# Patient Record
Sex: Female | Born: 1975 | Race: White | Hispanic: No | Marital: Married | Smoking: Never smoker
Health system: Southern US, Community
[De-identification: ages and names within clinical notes are randomized; demographics above are authoritative.]

## PROBLEM LIST (undated history)

## (undated) DIAGNOSIS — K5010 Crohn's disease of large intestine without complications: Secondary | ICD-10-CM

## (undated) HISTORY — PX: OTHER SURGICAL HISTORY: SHX169

---

## 2004-05-02 ENCOUNTER — Emergency Department (HOSPITAL_COMMUNITY): Admission: EM | Admit: 2004-05-02 | Discharge: 2004-05-03 | Payer: Self-pay | Admitting: Emergency Medicine

## 2007-06-30 ENCOUNTER — Ambulatory Visit: Payer: Self-pay | Admitting: Internal Medicine

## 2011-08-15 ENCOUNTER — Ambulatory Visit: Payer: Self-pay | Admitting: Family Medicine

## 2012-04-30 ENCOUNTER — Encounter: Payer: Self-pay | Admitting: *Deleted

## 2012-05-13 ENCOUNTER — Encounter: Payer: Self-pay | Admitting: *Deleted

## 2012-06-13 ENCOUNTER — Encounter: Payer: Self-pay | Admitting: *Deleted

## 2013-02-14 ENCOUNTER — Ambulatory Visit: Payer: Self-pay | Admitting: Family Medicine

## 2013-04-25 IMAGING — US US OB < 14 WEEKS
1 series · 17 of 28 positions shown · non-contrast
Comparison: none

REASON FOR EXAM: size greater than dates  Alia Tiger  fax
[phone]  ph [phone]  Wo...
COMMENTS:

[Series 1: us ob < 14 weeks · 17 of 49 slices shown]
[im 1/49]
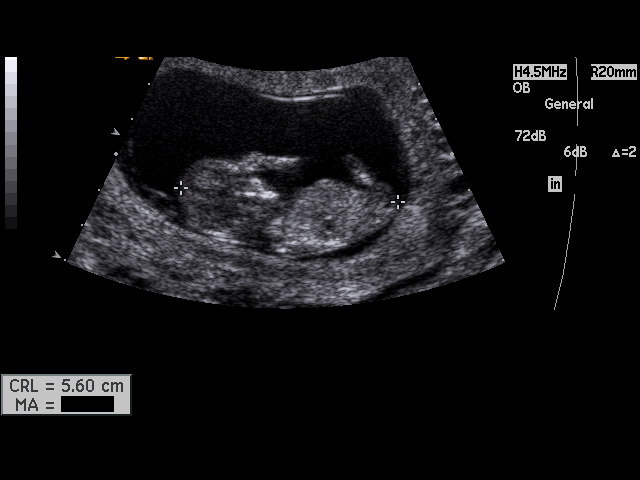
[im 4/49]
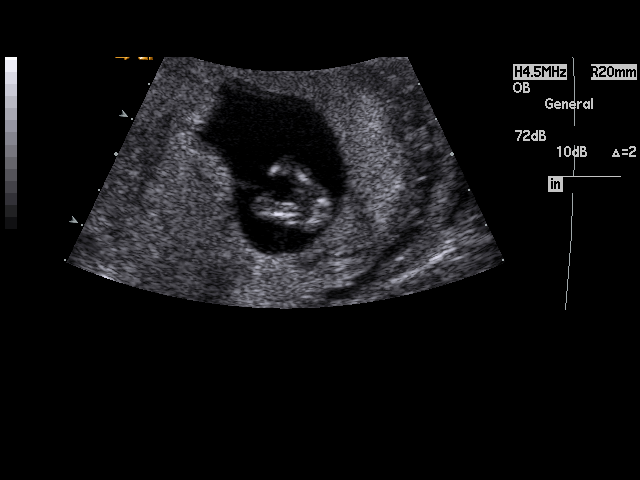
[im 8/49]
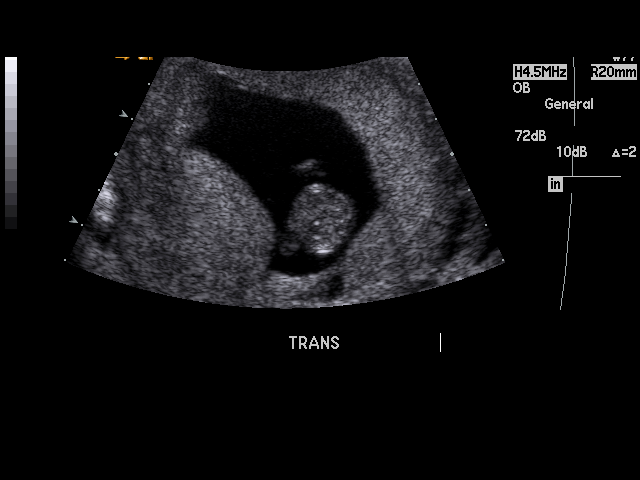
[im 9/49]
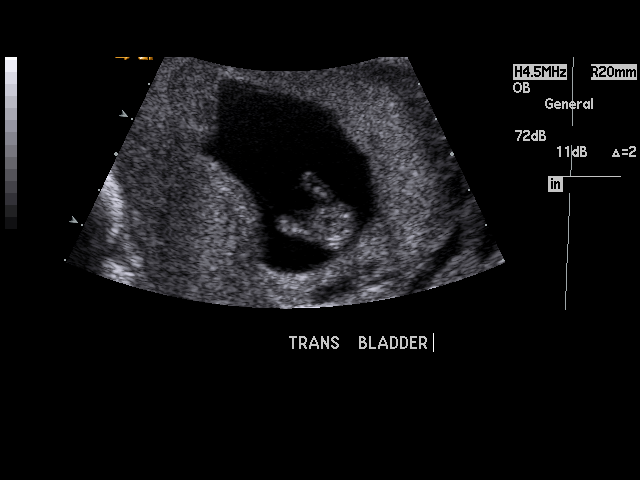
[im 13/49]
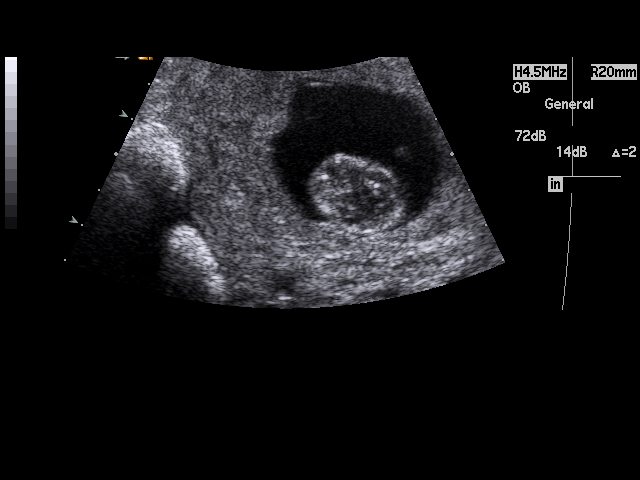
[im 17/49]
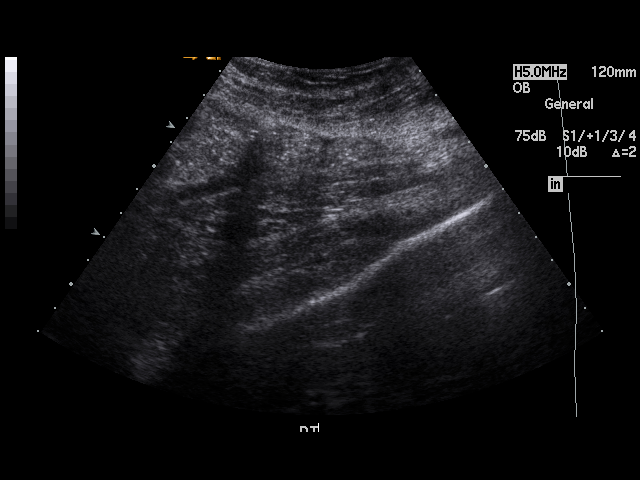
[im 18/49]
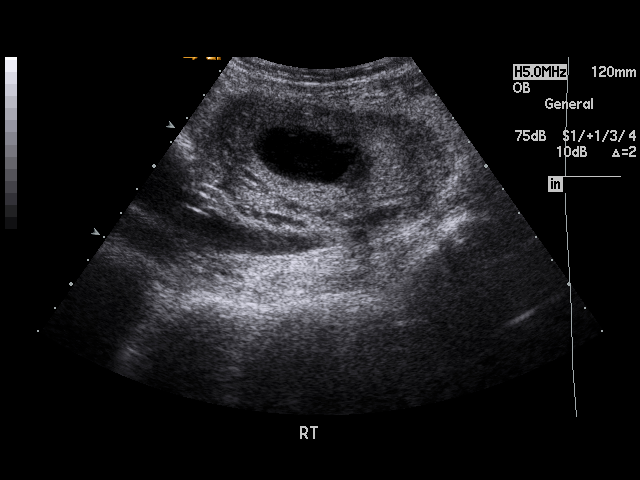
[im 22/49]
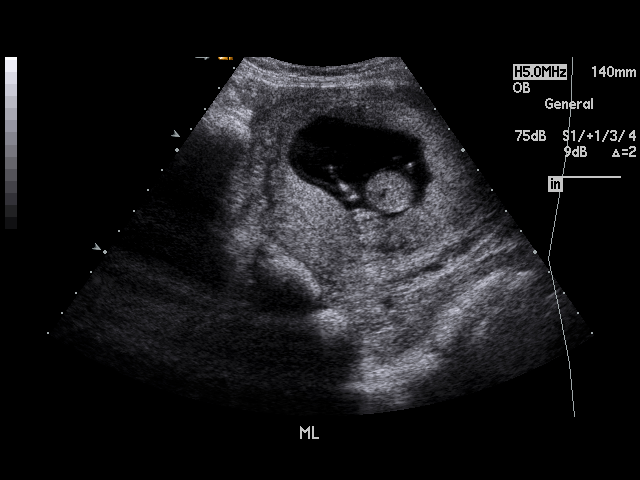
[im 25/49]
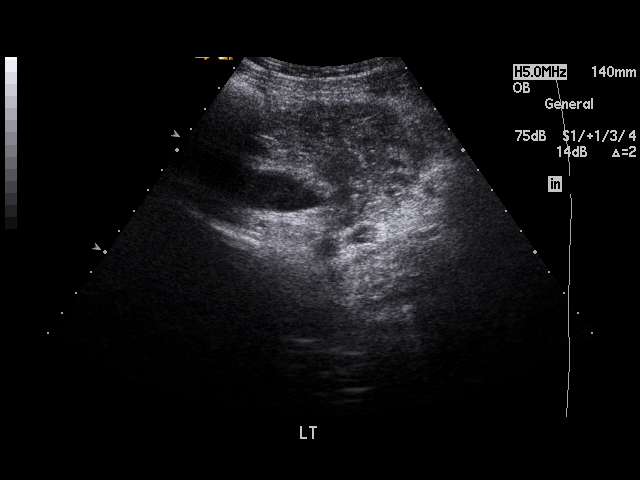
[im 27/49]
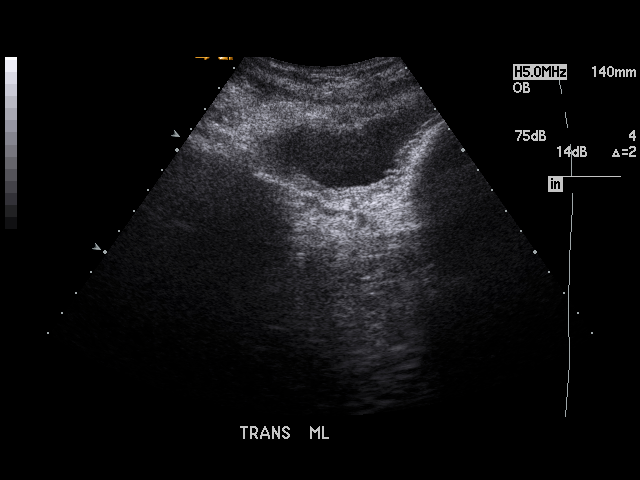
[im 31/49]
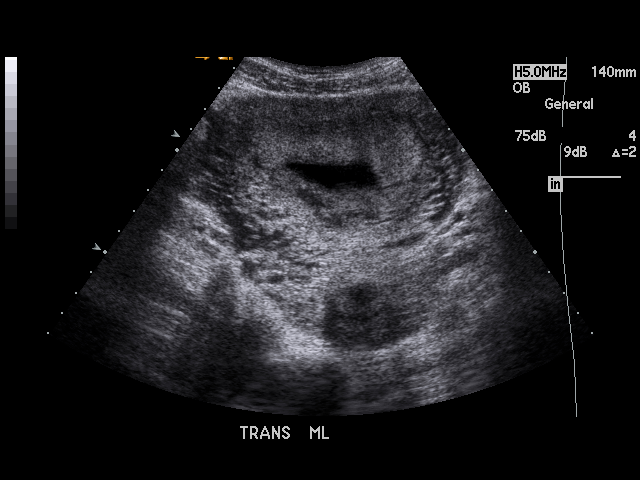
[im 33/49]
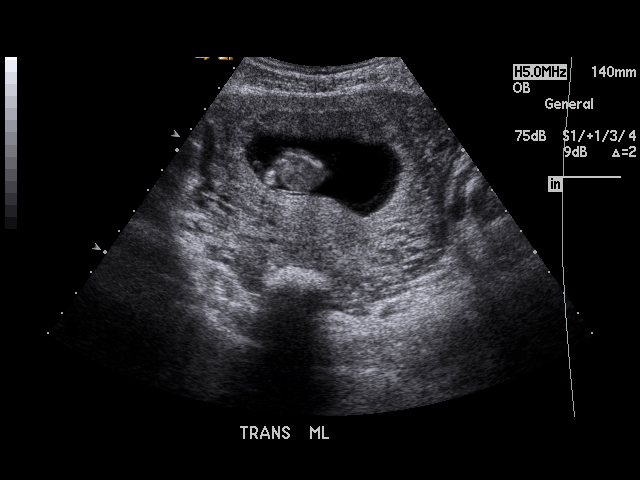
[im 36/49]
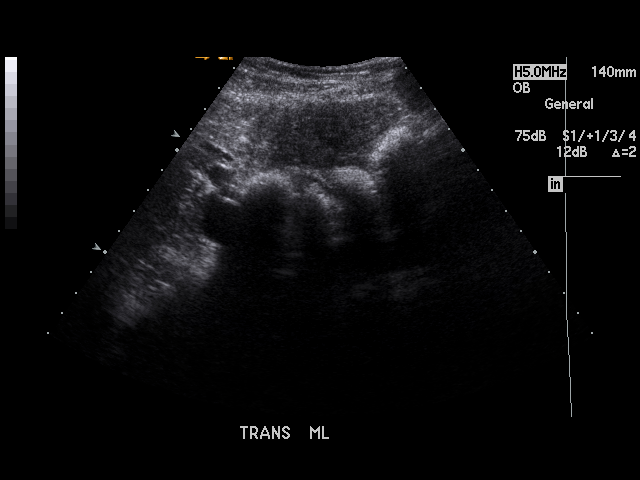
[im 40/49]
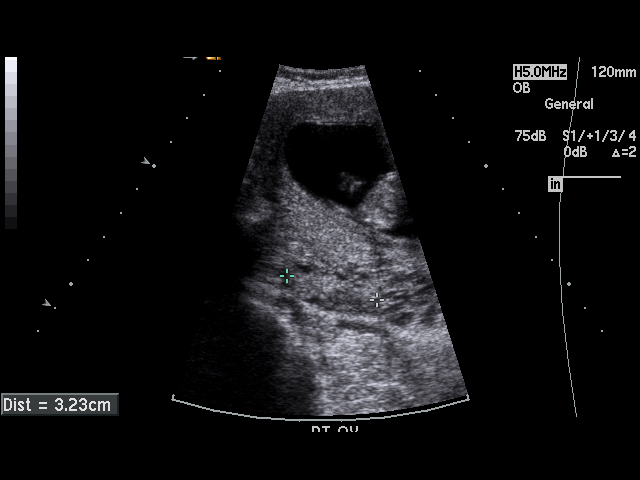
[im 41/49]
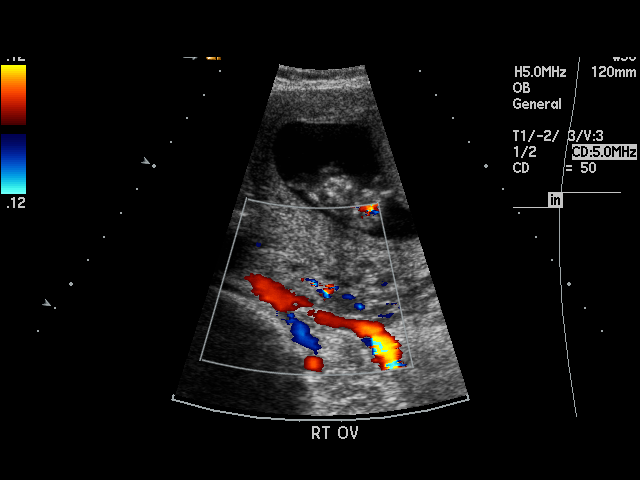
[im 45/49]
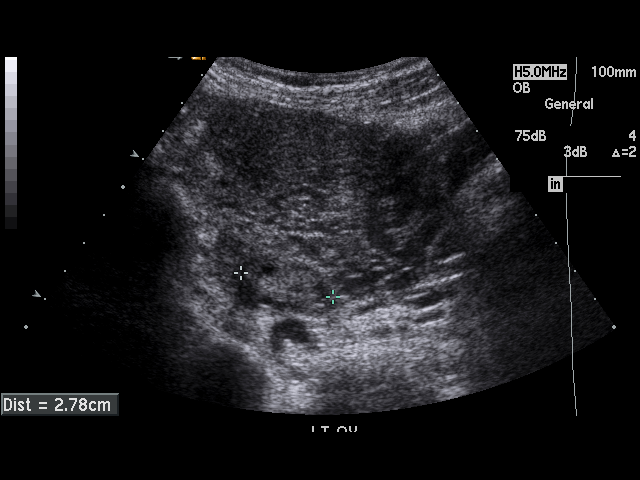
[im 49/49]
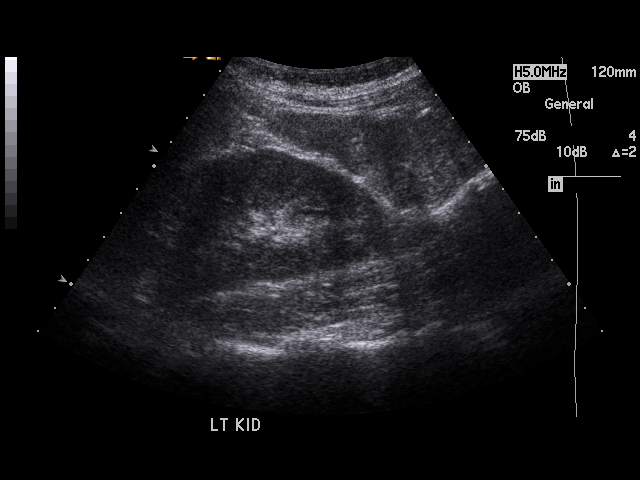

[17 of 28 positions shown; findings below may reference images not displayed]

PROCEDURE:     US  - US OB LESS THAN 14 WEEKS  - August 15, 2011  [DATE]

RESULT:     There is observed a single living intrauterine gestation. Fetal
heart rate was monitored at 163 beats per minute. The crown-rump length
measures 5.55 cm, corresponding to an estimated menstrual age of 12 weeks, 1
day. Ultrasound EDD is February 26, 2012. The maternal ovaries are visualized
bilaterally. The right ovary measures 3.23 cm at maximum diameter and the
left ovary measures 2.78 cm at maximum diameter. No abnormal adnexal masses
are seen. No free fluid is observed in the maternal pelvis. The maternal
kidneys are visualized bilaterally and show no hydronephrosis.
IMPRESSION: 1.  There is a single living intrauterine gestation of approximately 12
weeks, 1 day menstrual age.
2.  Ultrasound EDD is February 26, 2012.

## 2014-10-26 IMAGING — CT CT ABD-PELV W/ CM
1 of 2 series · 15 of 32 positions shown, 19 images · non-contrast
Comparison: none

REASON FOR EXAM: preg test  abd pain eval for lymphoma
COMMENTS:

[Series 2: soft tissue · axial · 0.69mm/px · z∈[-786,-342]mm · 15 of 162 slices shown, 19 images]
[im 7/162  soft-tissue]
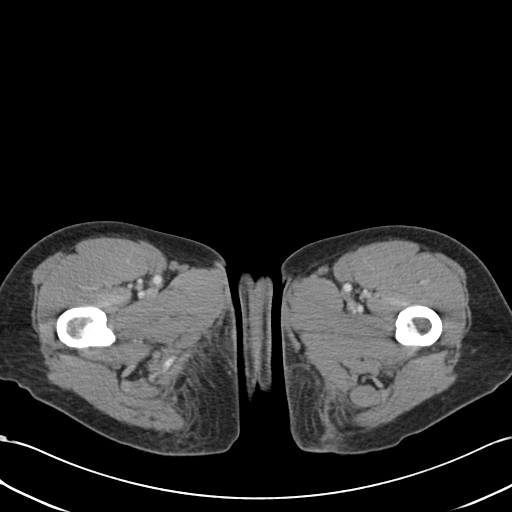
[im 7/162  bone]
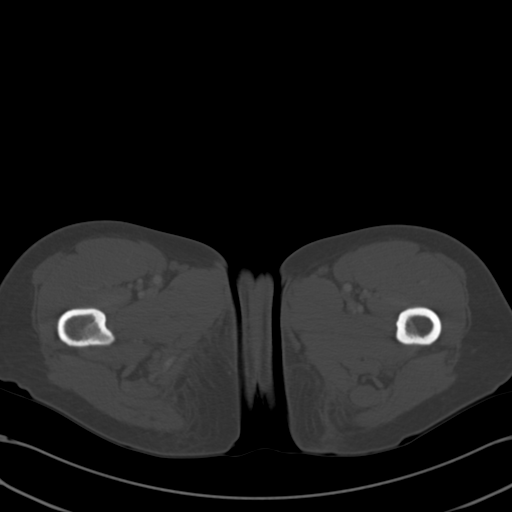
[im 19/162  soft-tissue]
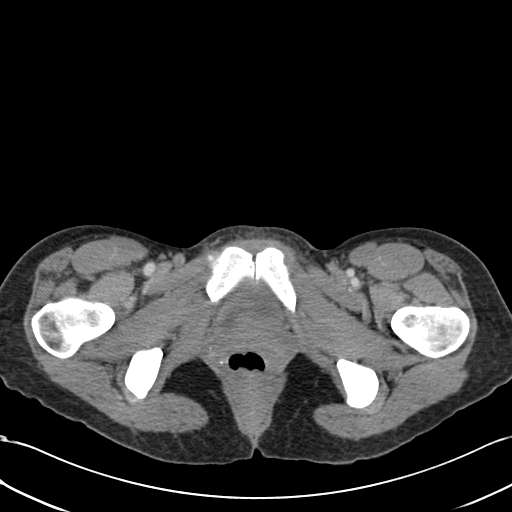
[im 31/162  soft-tissue]
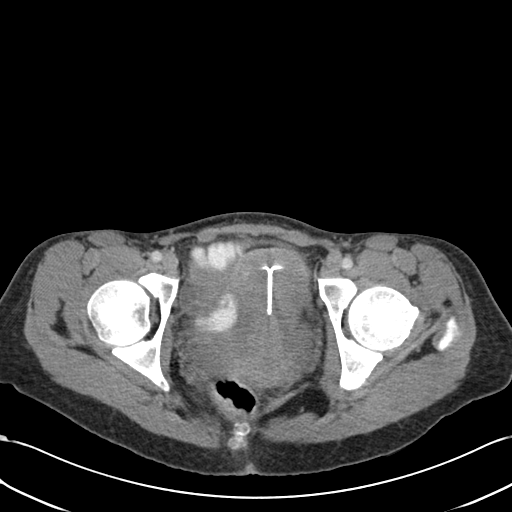
[im 44/162  soft-tissue]
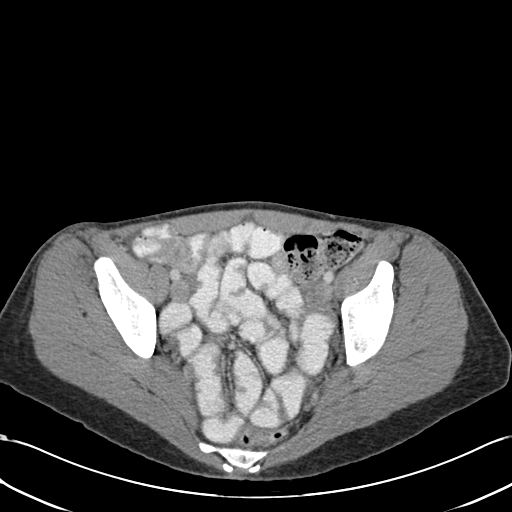
[im 56/162  soft-tissue]
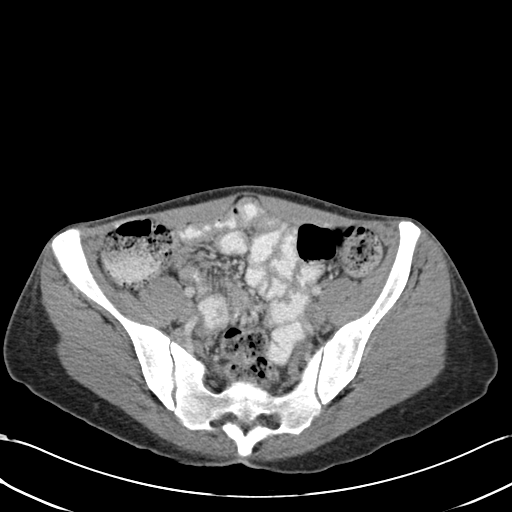
[im 69/162  soft-tissue]
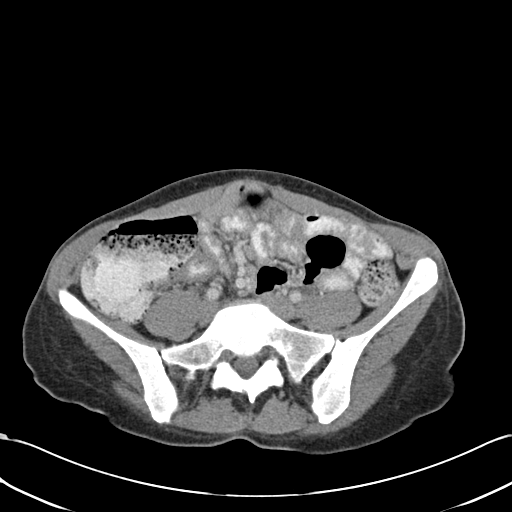
[im 81/162  soft-tissue]
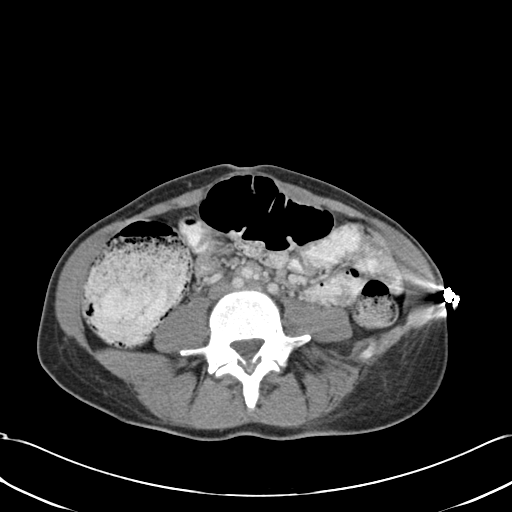
[im 93/162  soft-tissue]
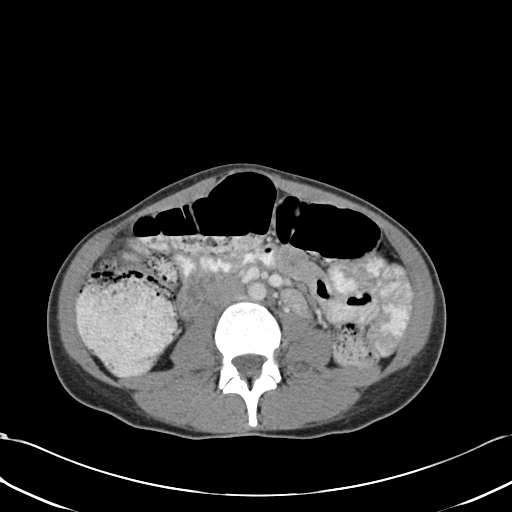
[im 106/162  soft-tissue]
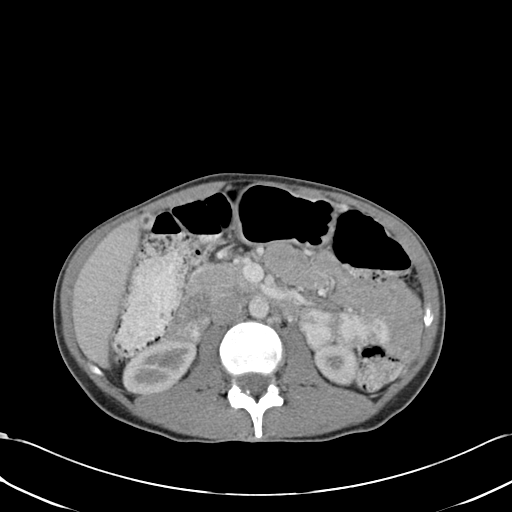
[im 106/162  bone]
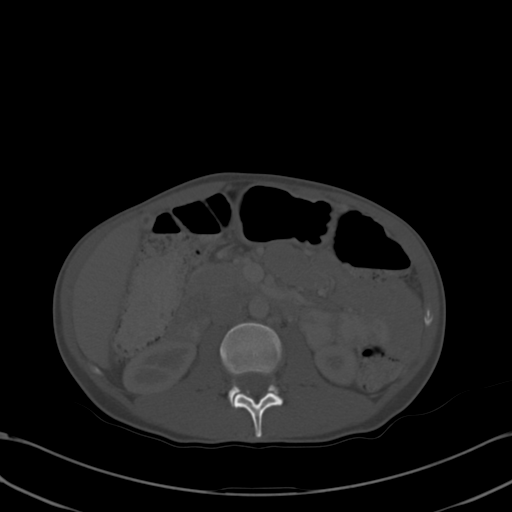
[im 118/162  soft-tissue]
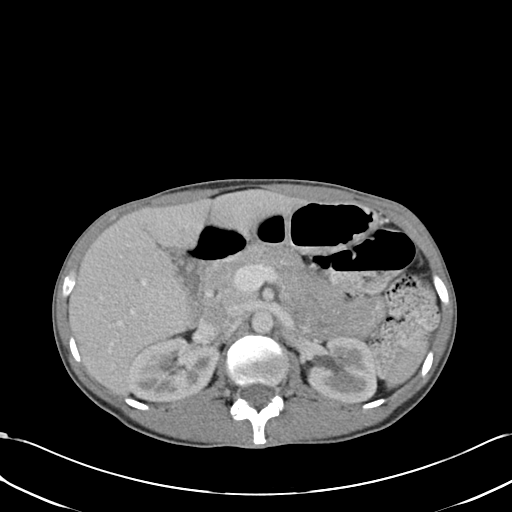
[im 131/162  soft-tissue]
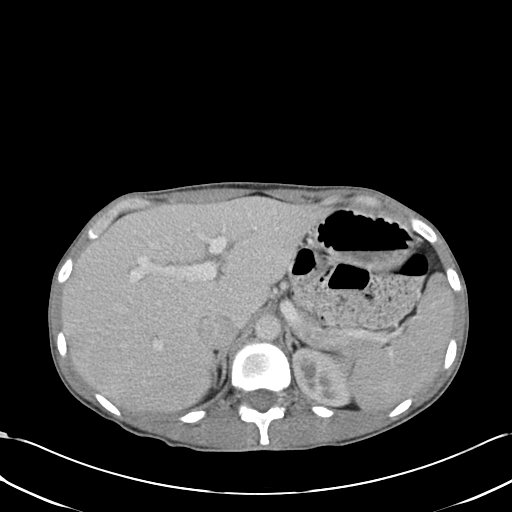
[im 137/162  lung]
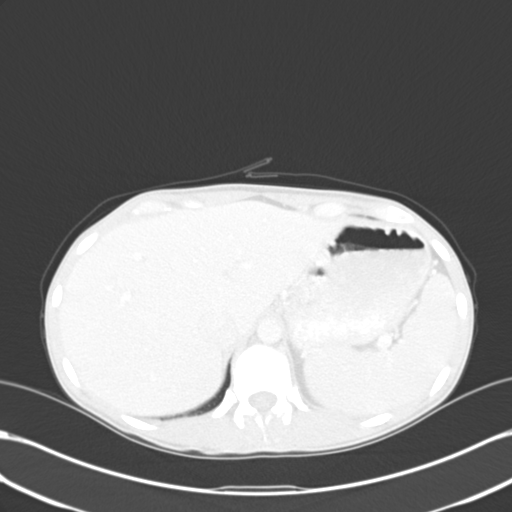
[im 143/162  soft-tissue]
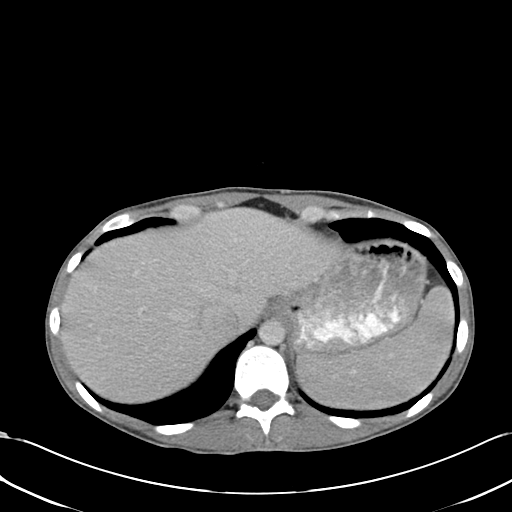
[im 143/162  lung]
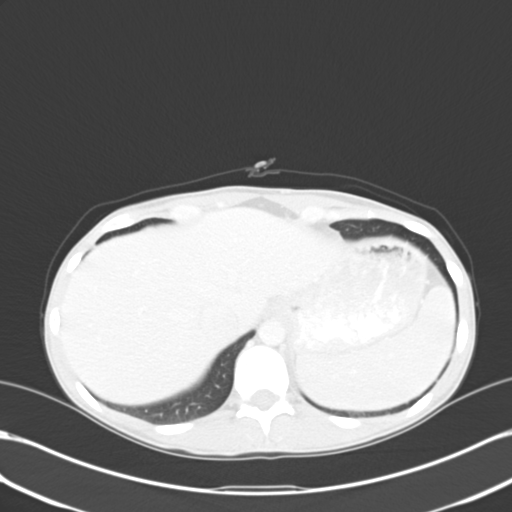
[im 149/162  lung]
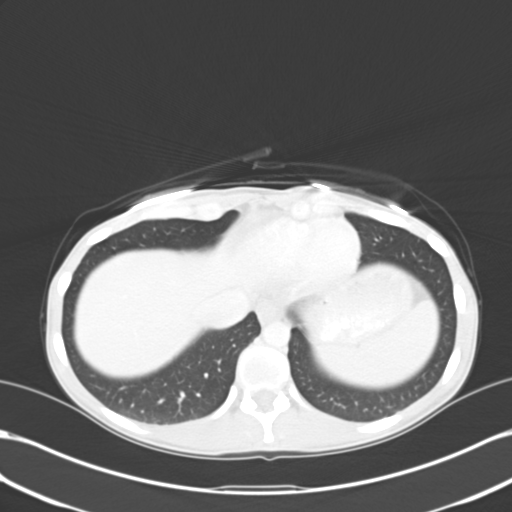
[im 155/162  soft-tissue]
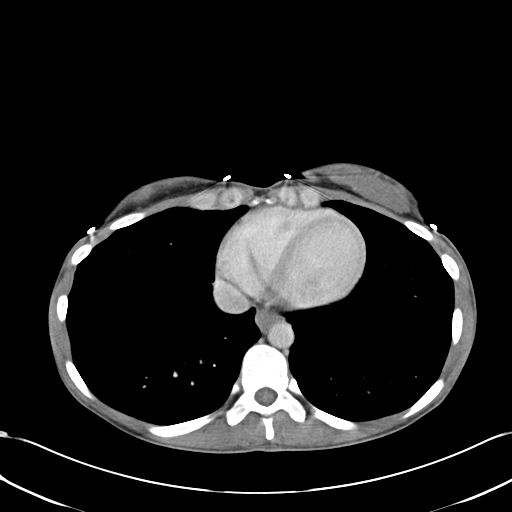
[im 155/162  lung]
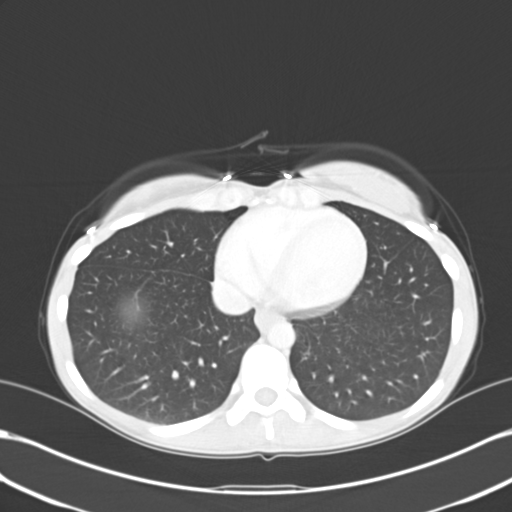

[15 of 32 positions shown; findings below may reference images not displayed]

PROCEDURE:     CT  - CT ABDOMEN / PELVIS  W  - February 14, 2013  [DATE]

RESULT:     CT of the abdomen and pelvis is performed utilizing 85 mL of
Xsovue-5WW iodinated intravenous contrast and oral contrast with images
reconstructed at 3 mm slice thickness in the axial plane. The patient has no
previous similar study for comparison.

Images through the base the lungs demonstrate normal appearing aeration. No
pleural or pericardial effusion is present. Spleen shows a maximum length of
11.9 cm an oblique transverse dimension. Nondistended gallbladder is present
without definite stones. There is a moderate amount of fecal material
present within the colon consistent with constipation. Correlate clinically.
An intrauterine device is seen. No adnexal masses appreciated. The urinary
bladder appears unremarkable. No significant ascites or abnormal fluid
collection is evident. No inflammatory stranding is appreciated. The
appendix is not identified. The aorta is normal in caliber. The kidneys show
no obstruction or mass and enhance normally. The liver, pancreas, adrenal
glands and retroperitoneum appear unremarkable. There is little mesenteric
or subcutaneous fatty tissue present. Bony structures appear to be
unremarkable.
IMPRESSION: 1. Findings suggestive of constipation. There is no finding to suggest
adenopathy or splenomegaly. In some areas, there is poor enhancement of the
small bowel with limited fatty tissue in the retroperitoneum it would be
difficult to exclude the possibility of an area of enlarged lymph nodes.

[REDACTED]

## 2020-04-23 ENCOUNTER — Ambulatory Visit: Payer: Self-pay

## 2020-04-23 DIAGNOSIS — Z23 Encounter for immunization: Secondary | ICD-10-CM

## 2020-04-23 NOTE — Progress Notes (Signed)
   Covid-19 Vaccination Clinic  Name:  Kara Leon    MRN: [id number] DOB: [birth year]  04/23/2020  [name] was observed post Covid-19 immunization for 15 minutes without incident. She was provided with Vaccine Information Sheet and instruction to access the V-Safe system.   [name] was instructed to call 911 with any severe reactions post vaccine: Marland Kitchen Difficulty breathing  . Swelling of face and throat  . A fast heartbeat  . A bad rash all over body  . Dizziness and weakness   Immunizations Administered    Name Date Dose VIS Date Route   Moderna COVID-19 Vaccine 04/23/2020 11:09 AM 0.5 mL 09/2019 Intramuscular   Manufacturer: Moderna   Lot: [id number]   NDC: 15520-802-23

## 2020-05-21 ENCOUNTER — Ambulatory Visit: Payer: Self-pay | Attending: Internal Medicine

## 2020-05-21 DIAGNOSIS — Z23 Encounter for immunization: Secondary | ICD-10-CM

## 2020-05-21 NOTE — Progress Notes (Signed)
   Covid-19 Vaccination Clinic  Name:  Kara Leon    MRN: [id number] DOB: 1976/03/28  05/21/2020  [name] was observed post Covid-19 immunization for 15 minutes without incident. She was provided with Vaccine Information Sheet and instruction to access the V-Safe system.   [name] was instructed to call 911 with any severe reactions post vaccine: Marland Kitchen Difficulty breathing  . Swelling of face and throat  . A fast heartbeat  . A bad rash all over body  . Dizziness and weakness   Immunizations Administered    Name Date Dose VIS Date Route   Moderna COVID-19 Vaccine 05/21/2020 12:36 PM 0.5 mL 09/2019 Intramuscular   Manufacturer: Gala Murdoch   Lot: [id number]   NDC: [id number]

## 2021-09-26 ENCOUNTER — Ambulatory Visit
Admission: EM | Admit: 2021-09-26 | Discharge: 2021-09-26 | Disposition: A | Discharge status: Home / Self Care | Payer: BC Managed Care – PPO

## 2021-09-26 ENCOUNTER — Encounter: Payer: Self-pay | Admitting: Licensed Clinical Social Worker

## 2021-09-26 ENCOUNTER — Other Ambulatory Visit: Payer: Self-pay

## 2021-09-26 DIAGNOSIS — H109 Unspecified conjunctivitis: Secondary | ICD-10-CM | POA: Diagnosis not present

## 2021-09-26 DIAGNOSIS — B9689 Other specified bacterial agents as the cause of diseases classified elsewhere: Secondary | ICD-10-CM

## 2021-09-26 HISTORY — DX: Crohn's disease of large intestine without complications: K50.10

## 2021-09-26 MED ORDER — POLYMYXIN B-TRIMETHOPRIM 10000-0.1 UNIT/ML-% OP SOLN: 1.0000 [drp] | OPHTHALMIC | 0 refills | Status: AC

## 2021-09-26 NOTE — ED Provider Notes (Signed)
[facility]    CSN: [id number] Arrival date & time: 09/26/21  1008      History   Chief Complaint Chief Complaint  Patient presents with   Eye Drainage   Conjunctivitis    HPI Kara Leon is a 45 y.o. female.   45 year old female patient, Kara Leon, presents to urgent care chief complaint of eye drainage, crusting this morning, redness  The history is provided by the patient. No language interpreter was used.   Past Medical History:  Diagnosis Date   Crohn's colitis [facility])     Patient Active Problem List   Diagnosis Date Noted   Bacterial conjunctivitis of both eyes 09/26/2021     Past Surgical History:  Procedure Laterality Date   chron's surgery      OB History   No obstetric history on file.      Home Medications    Prior to Admission medications   Medication Sig Start Date End Date Taking? Authorizing Provider  Adalimumab (HUMIRA PEN) 40 MG/0.4ML PNKT INJECT 1 PEN UNDER THE SKIN EVERY 7 DAYS. 11/15/18  Yes [provider]  trimethoprim-polymyxin b (POLYTRIM) ophthalmic solution Place 1 drop into both eyes every 3 (three) hours while awake for 7 days. 09/26/21 [id number] Yes Lynn Recendiz, Jeanett Schlein, NP    Family History History reviewed. No pertinent family history.  Social History Social History   Tobacco Use   Smoking status: Never   Smokeless tobacco: Never  Vaping Use   Vaping Use: Never used  Substance Use Topics   Alcohol use: Never   Drug use: Never     Allergies   Aspirin   Review of Systems Review of Systems  Constitutional:  Negative for fever.  Eyes:  Positive for discharge, redness and itching.  All other systems reviewed and are negative.   Physical Exam Triage Vital Signs ED Triage Vitals  Enc Vitals Group     BP 09/26/21 1119 99/65     Pulse Rate 09/26/21 1119 65     Resp 09/26/21 1119 16     Temp 09/26/21 1119 97.8 F (36.6 C)     Temp Source 09/26/21 1119 Oral     SpO2 09/26/21  1119 100 %     Weight 09/26/21 1114 132 lb (59.9 kg)     Height 09/26/21 1114 5\' 6"  (1.676 m)     Head Circumference --      Peak Flow --      Pain Score 09/26/21 1114 0     Pain Loc --      Pain Edu? --      Excl. in [place]? --    No data found.  Updated Vital Signs BP 99/65 (BP Location: Left Arm)    Pulse 65    Temp 97.8 F (36.6 C) (Oral)    Resp 16    Ht 5\' 6"  (1.676 m)    Wt 132 lb (59.9 kg)    LMP 09/12/2021    SpO2 100%    BMI 21.31 kg/m   Visual Acuity Right Eye Distance:   Left Eye Distance:   Bilateral Distance:    Right Eye Near:   Left Eye Near:    Bilateral Near:     Physical Exam Vitals and nursing note reviewed.  Constitutional:      Appearance: Normal appearance. She is well-developed and well-groomed.  HENT:     Head: Normocephalic.  Eyes:     General: Lids are normal.     Extraocular  Movements: Extraocular movements intact.     Conjunctiva/sclera:     Right eye: Right conjunctiva is injected.     Left eye: Left conjunctiva is injected.     Pupils: Pupils are equal, round, and reactive to light.     Comments: Reports yellow exudate this a.m.  Neck:     Trachea: Trachea normal.  Cardiovascular:     Rate and Rhythm: Normal rate and regular rhythm.  Pulmonary:     Effort: Pulmonary effort is normal.  Musculoskeletal:     Cervical back: Normal range of motion.  Neurological:     General: No focal deficit present.     Mental Status: She is alert and oriented to person, place, and time.     GCS: GCS eye subscore is 4. GCS verbal subscore is 5. GCS motor subscore is 6.  Psychiatric:        Attention and Perception: Attention normal.        Mood and Affect: Mood normal.        Speech: Speech normal.        Behavior: Behavior normal. Behavior is cooperative.     UC Treatments / Results  Labs (all labs ordered are listed, but only abnormal results are displayed) Labs Reviewed - No data to display  EKG   Radiology No results  found.  Procedures Procedures (including critical care time)  Medications Ordered in UC Medications - No data to display  Initial Impression / Assessment and Plan / UC Course  I have reviewed the triage vital signs and the nursing notes.  Pertinent labs & imaging results that were available during my care of the patient were reviewed by me and considered in my medical decision making (see chart for details).     Ddx: Conjunctivitis, allergies Final Clinical Impressions(s) / UC Diagnoses   Final diagnoses:  Bacterial conjunctivitis of both eyes     Discharge Instructions      Use drops as directed. Take daily allergy med as label directed(over the counter) follow up with PCP/eye doctor.     ED Prescriptions     Medication Sig Dispense Auth. Provider   trimethoprim-polymyxin b (POLYTRIM) ophthalmic solution Place 1 drop into both eyes every 3 (three) hours while awake for 7 days. 10 mL Myosha Cuadras, Para March, NP      PDMP not reviewed this encounter.   Clancy Gourd, NP 09/26/21 1335

## 2021-09-26 NOTE — ED Triage Notes (Signed)
Pt c/o eye drainage and redness. Pt woke up with discharge that was sticky this morning.

## 2021-09-26 NOTE — Discharge Instructions (Signed)
Use drops as directed. Take daily allergy med as label directed(over the counter) follow up with PCP/eye doctor.

## 2021-10-03 ENCOUNTER — Ambulatory Visit
Admission: RE | Admit: 2021-10-03 | Discharge: 2021-10-03 | Disposition: A | Discharge status: Home / Self Care | Payer: BC Managed Care – PPO | Attending: Family Medicine | Admitting: Family Medicine

## 2021-10-03 ENCOUNTER — Ambulatory Visit
Admission: RE | Admit: 2021-10-03 | Discharge: 2021-10-03 | Disposition: A | Discharge status: Home / Self Care | Payer: BC Managed Care – PPO | Source: Ambulatory Visit | Attending: Family Medicine | Admitting: Family Medicine

## 2021-10-03 ENCOUNTER — Ambulatory Visit: Payer: Self-pay

## 2021-10-03 ENCOUNTER — Other Ambulatory Visit: Payer: Self-pay

## 2021-10-03 ENCOUNTER — Other Ambulatory Visit: Payer: Self-pay | Admitting: Family Medicine

## 2021-10-03 DIAGNOSIS — M546 Pain in thoracic spine: Secondary | ICD-10-CM | POA: Insufficient documentation

## 2022-05-23 ENCOUNTER — Other Ambulatory Visit: Payer: Self-pay | Admitting: Family Medicine

## 2022-05-23 DIAGNOSIS — Z1231 Encounter for screening mammogram for malignant neoplasm of breast: Secondary | ICD-10-CM

## 2022-06-17 ENCOUNTER — Ambulatory Visit
Admission: RE | Admit: 2022-06-17 | Discharge: 2022-06-17 | Disposition: A | Discharge status: Home / Self Care | Payer: BC Managed Care – PPO | Source: Ambulatory Visit | Attending: Family Medicine | Admitting: Family Medicine

## 2022-06-17 DIAGNOSIS — Z1231 Encounter for screening mammogram for malignant neoplasm of breast: Secondary | ICD-10-CM | POA: Insufficient documentation

## 2023-08-18 ENCOUNTER — Other Ambulatory Visit: Payer: Self-pay | Admitting: Certified Nurse Midwife

## 2023-08-18 DIAGNOSIS — Z1231 Encounter for screening mammogram for malignant neoplasm of breast: Secondary | ICD-10-CM

## 2023-10-05 ENCOUNTER — Ambulatory Visit (INDEPENDENT_AMBULATORY_CARE_PROVIDER_SITE_OTHER): Payer: BC Managed Care – PPO

## 2023-10-05 ENCOUNTER — Encounter: Payer: Self-pay | Admitting: Emergency Medicine

## 2023-10-05 ENCOUNTER — Ambulatory Visit
Admission: EM | Admit: 2023-10-05 | Discharge: 2023-10-05 | Disposition: A | Discharge status: Home / Self Care | Payer: BC Managed Care – PPO | Attending: Family Medicine | Admitting: Family Medicine

## 2023-10-05 DIAGNOSIS — Z2089 Contact with and (suspected) exposure to other communicable diseases: Secondary | ICD-10-CM | POA: Diagnosis present

## 2023-10-05 DIAGNOSIS — J069 Acute upper respiratory infection, unspecified: Secondary | ICD-10-CM | POA: Insufficient documentation

## 2023-10-05 LAB — RESP PANEL BY RT-PCR (RSV, FLU A&B, COVID)  RVPGX2
Influenza A by PCR: NEGATIVE
Influenza B by PCR: NEGATIVE
Resp Syncytial Virus by PCR: NEGATIVE
SARS Coronavirus 2 by RT PCR: NEGATIVE

## 2023-10-05 NOTE — ED Triage Notes (Signed)
Pt c/o cough, runny nose and fatigue since yesterday. Pt took NyQuil last night.

## 2023-10-05 NOTE — Discharge Instructions (Addendum)
Your COVID, influenza and RSV test are all negative. Your chest xray did not show evidence of pneumonia though the radiologist has not yet read it. If they find something that I didn't, I will call you.    You can take Tylenol as needed for fever reduction and pain relief.    For cough: honey 1/2 to 1 teaspoon (you can dilute the honey in water or another fluid).  You can also use guaifenesin and dextromethorphan for cough. You can use a humidifier for chest congestion and cough.  If you don't have a humidifier, you can sit in the bathroom with the hot shower running.      For sore throat: try warm salt water gargles, Mucinex sore throat cough drops or cepacol lozenges, throat spray, warm tea or water with lemon/honey, popsicles or ice, or OTC cold relief medicine for throat discomfort. You can also purchase chloraseptic spray at the pharmacy or dollar store.   For congestion: take a daily anti-histamine like Zyrtec, Claritin, and a oral decongestant, such as pseudoephedrine.  You can also use Flonase 1-2 sprays in each nostril daily. Afrin is also a good option, if you do not have high blood pressure.    It is important to stay hydrated: drink plenty of fluids (water, gatorade/powerade/pedialyte, juices, or teas) to keep your throat moisturized and help further relieve irritation/discomfort.    Return or go to the Emergency Department if symptoms worsen or do not improve in the next few days

## 2023-10-05 NOTE — ED Provider Notes (Signed)
[facility]    CSN: [id number] Arrival date & time: 10/05/23  1302      History   Chief Complaint Chief Complaint  Patient presents with   Cough   Nasal Congestion   Fatigue    HPI AMIJA DODRILL is a 47 y.o. female.   HPI  History obtained from the patient. Marilda presents for cough, fatigue, rhinorrhea that started 2 days ago. Went shopping and started feeling fatigued. Her daughter had walking pneumonia.  No fever. Tmax 99.1 F. Taking immunosuppresants for Chron's. No diarrhea, abdominal pain or vomiting. Denies sore throat, headache. Took NyQuil last night.     Past Medical History:  Diagnosis Date   Crohn's colitis [facility])     Patient Active Problem List   Diagnosis Date Noted   Bacterial conjunctivitis of both eyes 09/26/2021    Past Surgical History:  Procedure Laterality Date   chron's surgery      OB History   No obstetric history on file.      Home Medications    Prior to Admission medications   Medication Sig Start Date End Date Taking? Authorizing Provider  FERREX 150 150 MG capsule TAKE 1 CAPSULE (150 MG TOTAL) BY MOUTH TWO (2) TIMES A DAY. 02/07/20  Yes [provider]  hydrocortisone (ANUSOL-HC) 25 MG suppository Place 25 mg rectally 2 (two) times daily. 08/06/23  Yes [provider]  RINVOQ 45 MG TB24 Take by mouth. 07/13/23  Yes [provider]  cyanocobalamin (VITAMIN B12) 1000 MCG tablet Take by mouth.    [provider]    Family History History reviewed. No pertinent family history.  Social History Social History   Tobacco Use   Smoking status: Never   Smokeless tobacco: Never  Vaping Use   Vaping status: Never Used  Substance Use Topics   Alcohol use: Never   Drug use: Never     Allergies   Aspirin and Gluten meal   Review of Systems Review of Systems: negative unless otherwise stated in HPI.      Physical Exam Triage Vital Signs ED Triage Vitals  Encounter  Vitals Group     BP 10/05/23 1504 105/80     Systolic BP Percentile --      Diastolic BP Percentile --      Pulse Rate 10/05/23 1504 88     Resp 10/05/23 1504 18     Temp 10/05/23 1504 99 F (37.2 C)     Temp Source 10/05/23 1504 Oral     SpO2 10/05/23 1504 100 %     Weight --      Height --      Head Circumference --      Peak Flow --      Pain Score 10/05/23 1502 0     Pain Loc --      Pain Education --      Exclude from Growth Chart --    No data found.  Updated Vital Signs BP 105/80 (BP Location: Left Arm)   Pulse 88   Temp 99 F (37.2 C) (Oral)   Resp 18   LMP 09/21/2023 (Approximate)   SpO2 100%   Visual Acuity Right Eye Distance:   Left Eye Distance:   Bilateral Distance:    Right Eye Near:   Left Eye Near:    Bilateral Near:     Physical Exam GEN:     alert, ill but non-toxic appearing female in no distress  HENT:  mucus membranes moist, oropharyngeal without lesions or erythema, no tonsillar hypertrophy or exudates, no nasal discharge EYES:   no scleral injection or discharge NECK:  normal ROM, no meningismus   RESP:  no increased work of breathing, clear to auscultation bilaterally though decreased air movement  CVS:   regular rate and rhythm Skin:   warm and dry    UC Treatments / Results  Labs (all labs ordered are listed, but only abnormal results are displayed) Labs Reviewed  RESP PANEL BY RT-PCR (RSV, FLU A&B, COVID)  [id number]    EKG   Radiology DG Chest 2 View Result Date: 10/05/2023 CLINICAL DATA:  Cough EXAM: CHEST - 2 VIEW COMPARISON:  None Available. FINDINGS: The heart size and mediastinal contours are within normal limits. No focal pulmonary opacity. No pleural effusion or pneumothorax. The visualized upper abdomen is unremarkable. No acute osseous abnormality. IMPRESSION: No active cardiopulmonary disease. Electronically Signed   By: Jacob Moores M.D.   On: 10/05/2023 16:18    Procedures Procedures (including critical care  time)  Medications Ordered in UC Medications - No data to display  Initial Impression / Assessment and Plan / UC Course  I have reviewed the triage vital signs and the nursing notes.  Pertinent labs & imaging results that were available during my care of the patient were reviewed by me and considered in my medical decision making (see chart for details).       Pt is a 47 y.o. female who presents for 1 day of respiratory symptoms.  She had a close exposure to pneumonia.  Arra is afebrile here without recent antipyretics. Satting well on room air. Overall pt is ill but non-toxic appearing, well hydrated, without respiratory distress. Pulmonary exam is clear but she had decreased air movement bilaterally.  RSV, influenza and COVID panel obtained and was negative. Recommended chest x-ray and she is agreeable.  Chest xray personally reviewed by me without focal pneumonia, pleural effusion, cardiomegaly or pneumothorax.  Radiologist impression and reviewed.  History consistent with viral respiratory illness. Discussed symptomatic treatment.  Explained lack of efficacy of antibiotics in viral disease.  Typical duration of symptoms discussed.   Return and ED precautions given and voiced understanding. Discussed MDM, treatment plan and plan for follow-up with patient who agrees with plan.     Final Clinical Impressions(s) / UC Diagnoses   Final diagnoses:  Viral URI with cough  Pneumonia exposure     Discharge Instructions      Your COVID, influenza and RSV test are all negative. Your chest xray did not show evidence of pneumonia though the radiologist has not yet read it. If they find something that I didn't, I will call you.    You can take Tylenol as needed for fever reduction and pain relief.    For cough: honey 1/2 to 1 teaspoon (you can dilute the honey in water or another fluid).  You can also use guaifenesin and dextromethorphan for cough. You can use a humidifier for chest  congestion and cough.  If you don't have a humidifier, you can sit in the bathroom with the hot shower running.      For sore throat: try warm salt water gargles, Mucinex sore throat cough drops or cepacol lozenges, throat spray, warm tea or water with lemon/honey, popsicles or ice, or OTC cold relief medicine for throat discomfort. You can also purchase chloraseptic spray at the pharmacy or dollar store.   For congestion: take a daily anti-histamine  like Zyrtec, Claritin, and a oral decongestant, such as pseudoephedrine.  You can also use Flonase 1-2 sprays in each nostril daily. Afrin is also a good option, if you do not have high blood pressure.    It is important to stay hydrated: drink plenty of fluids (water, gatorade/powerade/pedialyte, juices, or teas) to keep your throat moisturized and help further relieve irritation/discomfort.    Return or go to the Emergency Department if symptoms worsen or do not improve in the next few days      ED Prescriptions   None    PDMP not reviewed this encounter.   Katha Cabal, DO 10/05/23 1656

## 2023-10-14 ENCOUNTER — Ambulatory Visit
Admission: EM | Admit: 2023-10-14 | Discharge: 2023-10-14 | Disposition: A | Discharge status: Home / Self Care | Payer: 59 | Attending: Family Medicine | Admitting: Family Medicine

## 2023-10-14 ENCOUNTER — Ambulatory Visit (INDEPENDENT_AMBULATORY_CARE_PROVIDER_SITE_OTHER): Payer: 59

## 2023-10-14 DIAGNOSIS — J989 Respiratory disorder, unspecified: Secondary | ICD-10-CM

## 2023-10-14 DIAGNOSIS — J189 Pneumonia, unspecified organism: Secondary | ICD-10-CM

## 2023-10-14 DIAGNOSIS — R509 Fever, unspecified: Secondary | ICD-10-CM

## 2023-10-14 MED ORDER — PROMETHAZINE-DM 6.25-15 MG/5ML PO SYRP: 5.0000 mL | ORAL_SOLUTION | Freq: Four times a day (QID) | ORAL | 0 refills | Status: AC | PRN

## 2023-10-14 MED ORDER — AMOXICILLIN-POT CLAVULANATE 875-125 MG PO TABS: 1.0000 | ORAL_TABLET | Freq: Two times a day (BID) | ORAL | 0 refills | Status: AC

## 2023-10-14 MED ORDER — PREDNISONE 10 MG (21) PO TBPK: ORAL_TABLET | Freq: Every day | ORAL | 0 refills | Status: AC

## 2023-10-14 MED ORDER — AZITHROMYCIN 250 MG PO TABS: ORAL_TABLET | ORAL | 0 refills | Status: AC

## 2023-10-14 NOTE — ED Triage Notes (Signed)
 Pt c/o continued cough x9days  Pt states that she was here on 10/05/23 and tested negative for covid and flu  Pt states that the cough has gotten worse and she had a temperature of 99.

## 2023-10-14 NOTE — Discharge Instructions (Addendum)
 Your chest x-ray showed an area that could be concerning for pneumonia.  I will call you if the radiologist calls this a pneumonia, you will see your results in MyChart.    Stop by the pharmacy to pick up your steroids, antibiotics and cough syrup.  Start your steroids tomorrow as they may keep you up at night.

## 2023-10-14 NOTE — ED Provider Notes (Signed)
 [facility]    CSN: [id number] Arrival date & time: 10/14/23  1536      History   Chief Complaint Chief Complaint  Patient presents with   Cough    HPI Kara Leon is a 48 y.o. female.   HPI  History obtained from the patient. Kara Leon presents for ongoing cough for over a week.  Cough is not improved since she was seen last about a week ago.  She had negative testing at that time.  She started having fevers a few days ago.  Feels like her symptoms are getting worse.  Continues to have mild rhinorrhea, some bodyaches.  No sore throat, vomiting or diarrhea.      Past Medical History:  Diagnosis Date   Crohn's colitis [facility])     Patient Active Problem List   Diagnosis Date Noted   Bacterial conjunctivitis of both eyes 09/26/2021    Past Surgical History:  Procedure Laterality Date   chron's surgery      OB History   No obstetric history on file.      Home Medications    Prior to Admission medications   Medication Sig Start Date End Date Taking? Authorizing Provider  amoxicillin -clavulanate (AUGMENTIN ) 875-125 MG tablet Take 1 tablet by mouth every 12 (twelve) hours. 10/14/23  Yes Madelon Welsch, DO  azithromycin  (ZITHROMAX  Z-PAK) 250 MG tablet Take 2 tablets on day 1 then 1 tablet daily 10/14/23  Yes Dmarion Perfect, DO  cyanocobalamin (VITAMIN B12) 1000 MCG tablet Take by mouth.   Yes [provider]  FERREX 150 150 MG capsule TAKE 1 CAPSULE (150 MG TOTAL) BY MOUTH TWO (2) TIMES A DAY. 02/07/20  Yes [provider]  hydrocortisone (ANUSOL-HC) 25 MG suppository Place 25 mg rectally 2 (two) times daily. 08/06/23  Yes [provider]  predniSONE  (STERAPRED UNI-PAK 21 TAB) 10 MG (21) TBPK tablet Take by mouth daily. Take 6 tabs by mouth daily for 1, then 5 tabs for 1 day, then 4 tabs for 1 day, then 3 tabs for 1 day, then 2 tabs for 1 day, then 1 tab for 1 day. 10/14/23  Yes Macgregor Aeschliman, DO  promethazine -dextromethorphan  (PROMETHAZINE -DM) 6.25-15 MG/5ML syrup Take 5 mLs by mouth 4 (four) times daily as needed. 10/14/23  Yes Meshach Perry, DO  RINVOQ 45 MG TB24 Take by mouth. 07/13/23  Yes [provider]    Family History History reviewed. No pertinent family history.  Social History Social History   Tobacco Use   Smoking status: Never   Smokeless tobacco: Never  Vaping Use   Vaping status: Never Used  Substance Use Topics   Alcohol use: Yes   Drug use: Never     Allergies   Aspirin and Gluten meal   Review of Systems Review of Systems: negative unless otherwise stated in HPI.      Physical Exam Triage Vital Signs ED Triage Vitals  Encounter Vitals Group     BP 10/14/23 1643 (!) 88/67     Systolic BP Percentile --      Diastolic BP Percentile --      Pulse Rate 10/14/23 1643 97     Resp --      Temp 10/14/23 1643 100.2 F (37.9 C)     Temp Source 10/14/23 1643 Oral     SpO2 10/14/23 1643 99 %     Weight 10/14/23 1641 132 lb (59.9 kg)     Height 10/14/23 1641 5' 6 (1.676 m)  Head Circumference --      Peak Flow --      Pain Score 10/14/23 1641 3     Pain Loc --      Pain Education --      Exclude from Growth Chart --    No data found.  Updated Vital Signs BP (!) 88/67 (BP Location: Left Arm)   Pulse 97   Temp 100.2 F (37.9 C) (Oral)   Ht 5' 6 (1.676 m)   Wt 59.9 kg   LMP 10/14/2023 (Approximate)   SpO2 99%   BMI 21.31 kg/m   Visual Acuity Right Eye Distance:   Left Eye Distance:   Bilateral Distance:    Right Eye Near:   Left Eye Near:    Bilateral Near:     Physical Exam GEN:     alert, non-toxic appearing female in no distress    EYES:   no scleral injection or discharge NECK:  normal ROM, no meningismus   RESP:  no increased work of breathing, scattered rhonchi, cough CVS:   regular rate and rhythm Skin:   warm and dry, no rash on visible skin    UC Treatments / Results  Labs (all labs ordered are listed, but only abnormal results  are displayed) Labs Reviewed - No data to display  EKG   Radiology DG Chest 2 View Result Date: 10/14/2023 CLINICAL DATA:  Cough and fever. EXAM: CHEST - 2 VIEW COMPARISON:  10/05/2023 FINDINGS: Heart size and mediastinal contours of appear normal. No pleural fluid or interstitial edema. Asymmetric opacities identified within the posterior left lower lobe. Right lung appears clear. Visualized osseous structures are unremarkable. IMPRESSION: Asymmetric opacities within the posterior left lower lobe compatible with pneumonia. Followup PA and lateral chest X-ray is recommended in 3-4 weeks following trial of antibiotic therapy to ensure resolution and exclude underlying malignancy. Electronically Signed   By: Waddell Calk M.D.   On: 10/14/2023 17:39    Procedures Procedures (including critical care time)  Medications Ordered in UC Medications - No data to display  Initial Impression / Assessment and Plan / UC Course  I have reviewed the triage vital signs and the nursing notes.  Pertinent labs & imaging results that were available during my care of the patient were reviewed by me and considered in my medical decision making (see chart for details).       Pt is a 48 y.o. female who has Crohn's disease presents for 1-2 weeks of cough that is not improving. On chart review, she had negative COVID, influenza and RSV testing on 10/05/2023.   Kara Leon has an elevated temperature here of 100.2 F.  Her blood pressure is 98/67.  Satting well on room air.   Overall pt is  non-toxic appearing, well hydrated, without respiratory distress. Pulmonary exam is remarkable for rhonchi that clear with cough.  After shared decision making, we will  pursue chest x-ray.  COVID  and influenza testing deferred due to length of symptoms.   Chest xray personally reviewed by me focal area on the left lower lung that is suspicious for pneumonia but could also be a nipple shadow.  No pleural effusion, cardiomegaly or  pneumothorax.  Patient aware the radiologist has not read her xray and is comfortable with the preliminary read by me. Will review radiologist read when available and call patient if a change in plan is warranted.  Pt agreeable to this plan prior to discharge.   Treat with steroids and antibiotics  as below.  Promethazine  DM cough syrup given for cough and allow patient to rest.  Typical duration of symptoms discussed. Return and ED precautions given and patient voiced understanding.   Discussed MDM, treatment plan and plan for follow-up with patient who agrees with plan.   Radiologist impression reviewed area of concern described above was considered left lower opacities consistent with pneumonia.  Patient called and updated with x-ray findings.  Augmentin  added to her regimen.  She will stop at the pharmacy to pick this up.  She will hold prednisone  for now.  Final Clinical Impressions(s) / UC Diagnoses   Final diagnoses:  Respiratory illness with fever  Community acquired pneumonia of left lower lobe of lung     Discharge Instructions      Your chest x-ray showed an area that could be concerning for pneumonia.  I will call you if the radiologist calls this a pneumonia, you will see your results in MyChart.    Stop by the pharmacy to pick up your steroids, antibiotics and cough syrup.  Start your steroids tomorrow as they may keep you up at night.     ED Prescriptions     Medication Sig Dispense Auth. Provider   predniSONE  (STERAPRED UNI-PAK 21 TAB) 10 MG (21) TBPK tablet Take by mouth daily. Take 6 tabs by mouth daily for 1, then 5 tabs for 1 day, then 4 tabs for 1 day, then 3 tabs for 1 day, then 2 tabs for 1 day, then 1 tab for 1 day. 21 tablet Aveyah Greenwood, DO   azithromycin  (ZITHROMAX  Z-PAK) 250 MG tablet Take 2 tablets on day 1 then 1 tablet daily 6 tablet Casin Federici, DO   promethazine -dextromethorphan (PROMETHAZINE -DM) 6.25-15 MG/5ML syrup Take 5 mLs by mouth 4 (four)  times daily as needed. 118 mL Hether Anselmo, DO   amoxicillin -clavulanate (AUGMENTIN ) 875-125 MG tablet Take 1 tablet by mouth every 12 (twelve) hours. 10 tablet Vishaal Strollo, DO      PDMP not reviewed this encounter.   Kriste Berth, DO 10/14/23 1747
